# Patient Record
Sex: Male | Born: 1995 | Race: White | Hispanic: No | Marital: Single | Smoking: Never smoker
Health system: Southern US, Community
[De-identification: ages and names within clinical notes are randomized; demographics above are authoritative.]

---
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
## 2015-05-12 IMAGING — CR DG SHOULDER 2+V*L*
2 series · 2 of 2 positions shown · non-contrast
Comparison: None.

CLINICAL DATA: Dislocated left shoulder during football game

LEFT SHOULDER - 2+ VIEW

[x shoulder ap left]
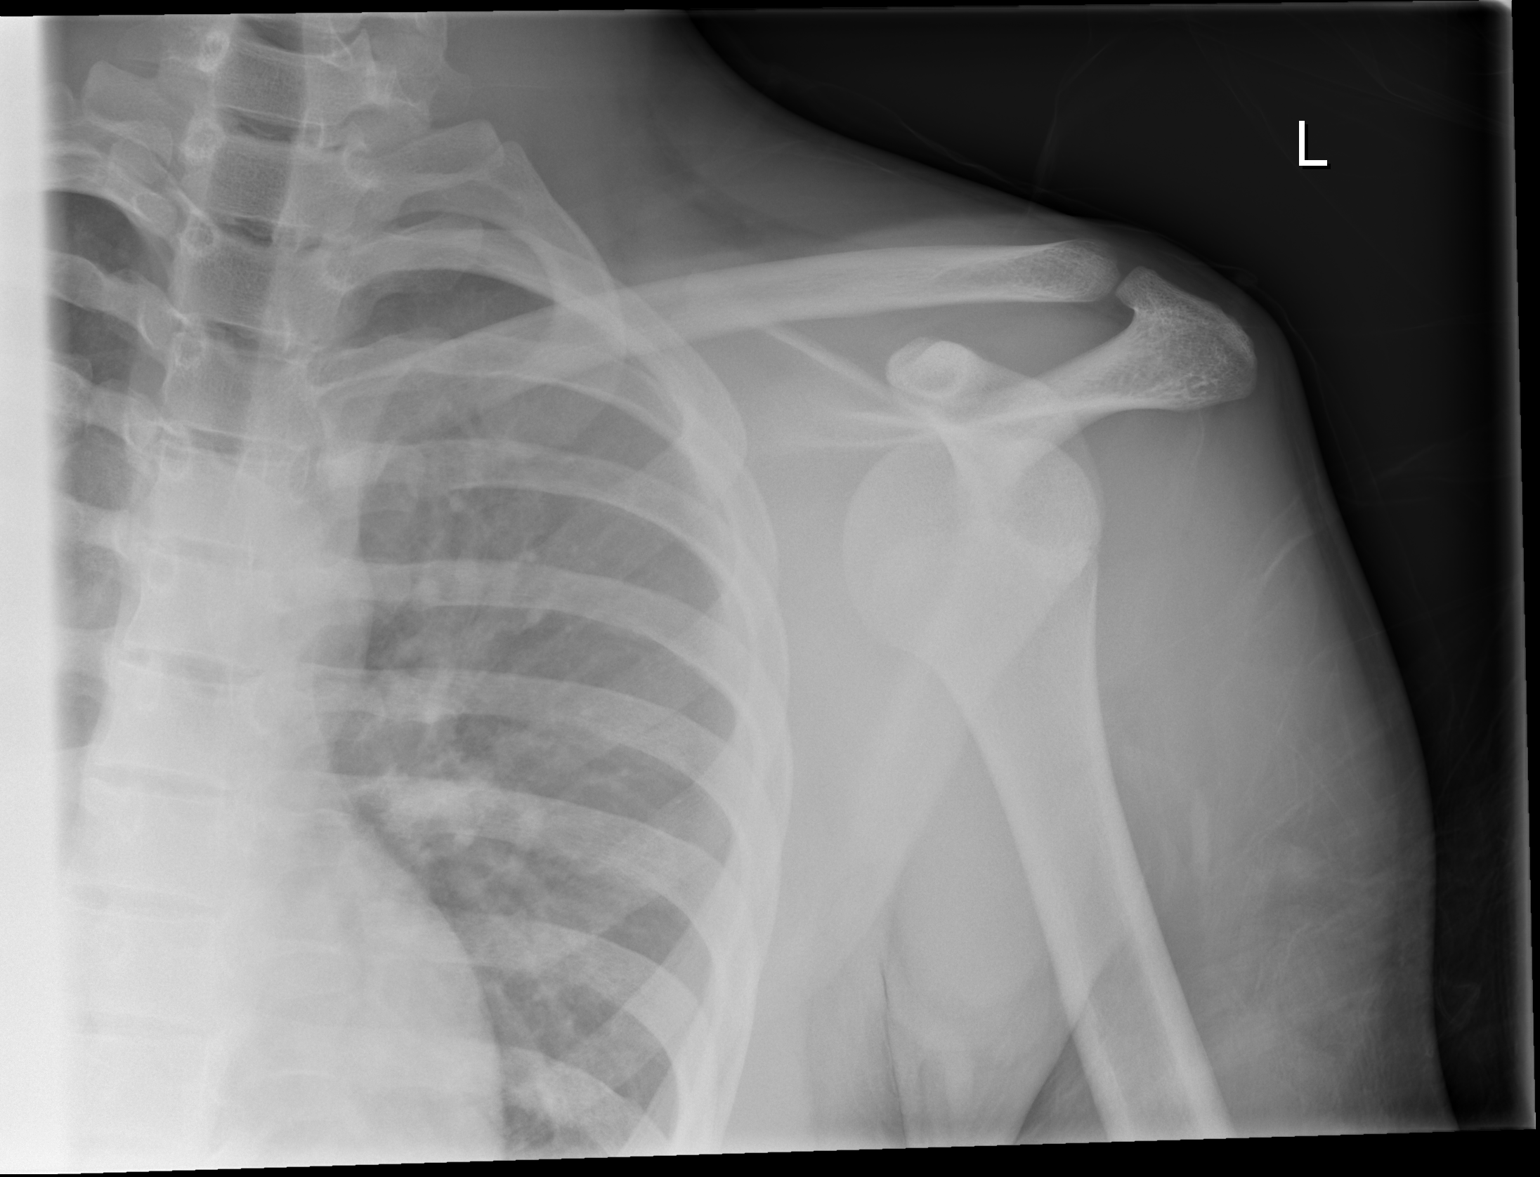

[x scapula y-view left]
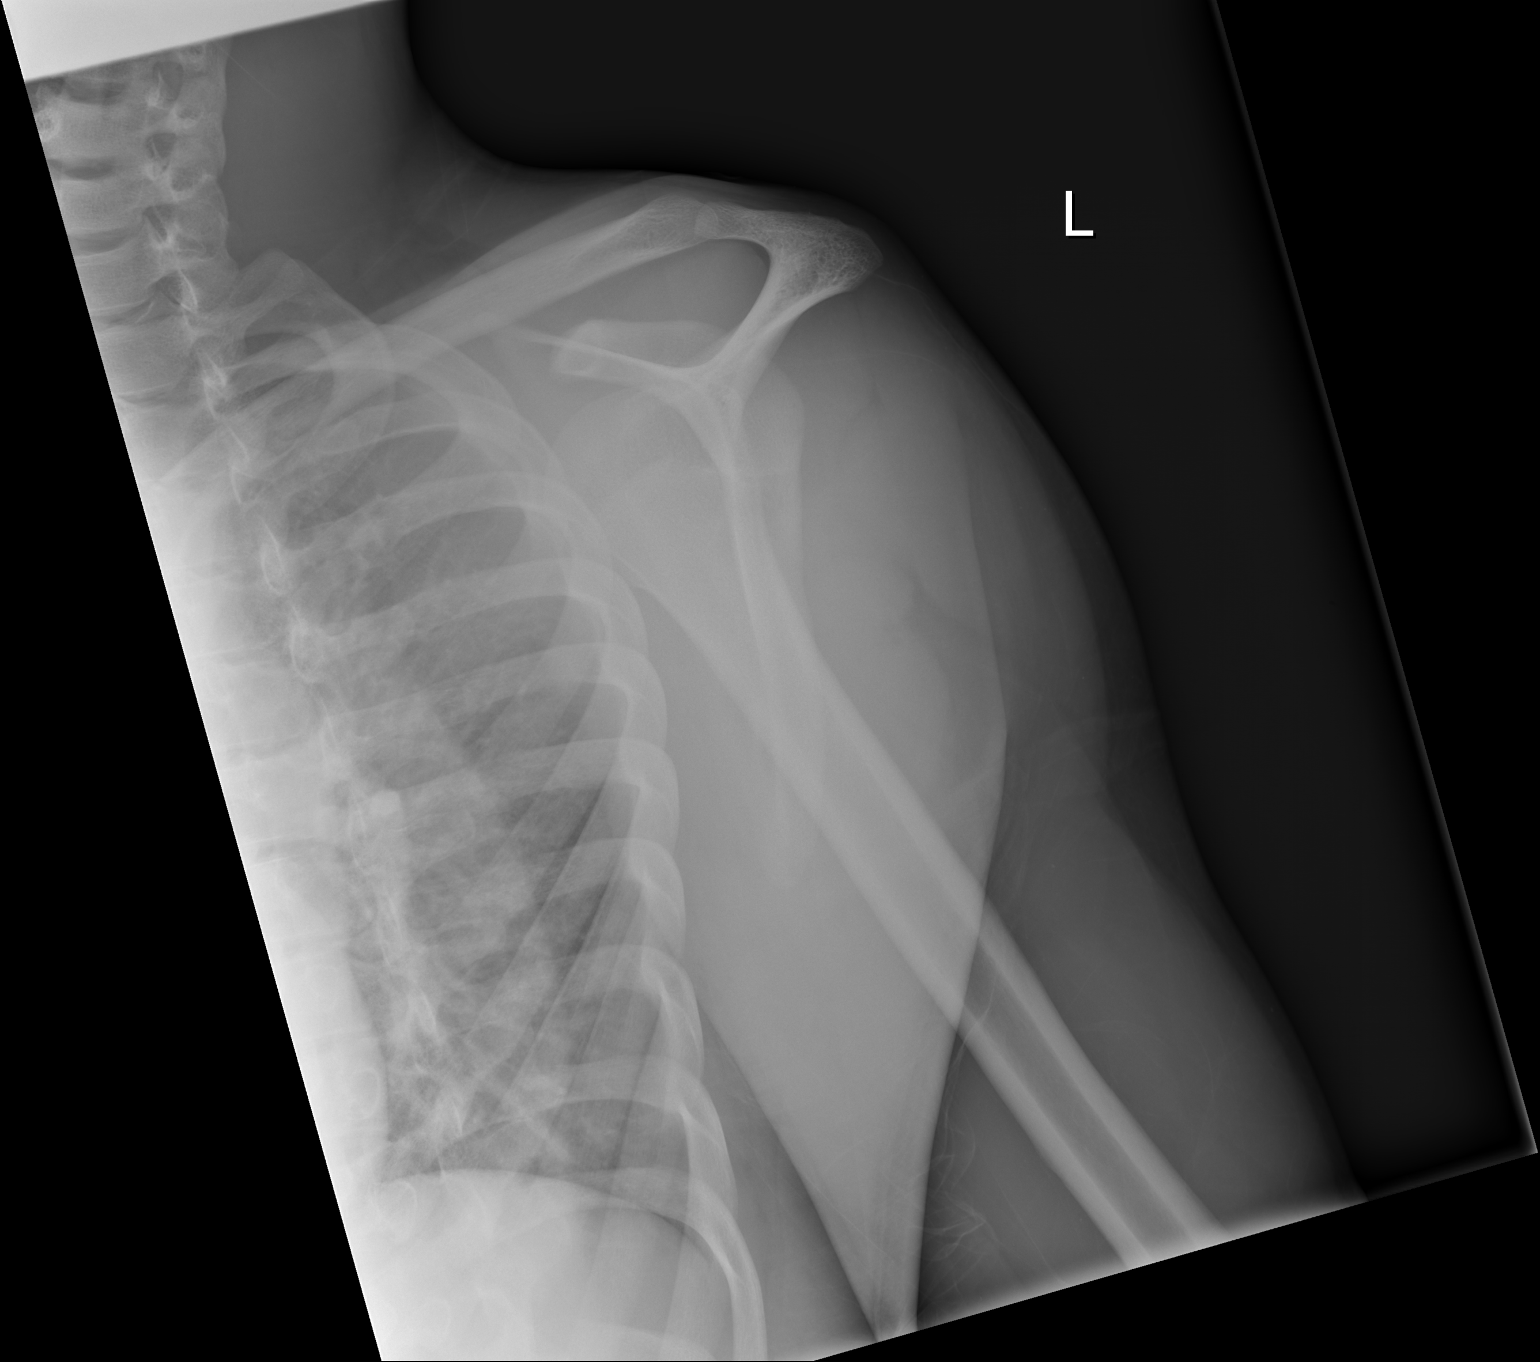

[2 of 2 positions shown; findings below may reference images not displayed]

FINDINGS: The left humeral head is anteriorly dislocated.  No
fracture is evident.  Acromioclavicular joint is aligned.
IMPRESSION: Anterior dislocation of the humeral head.

## 2015-05-12 IMAGING — CR DG SHOULDER 1V*L*
3 series · 3 of 3 positions shown · non-contrast
Comparison: Study obtained earlier in the day

CLINICAL DATA: Recent dislocation

EXAM:
PORTABLE LEFT SHOULDER - 2+ VIEW

[AP (1 of 3)]
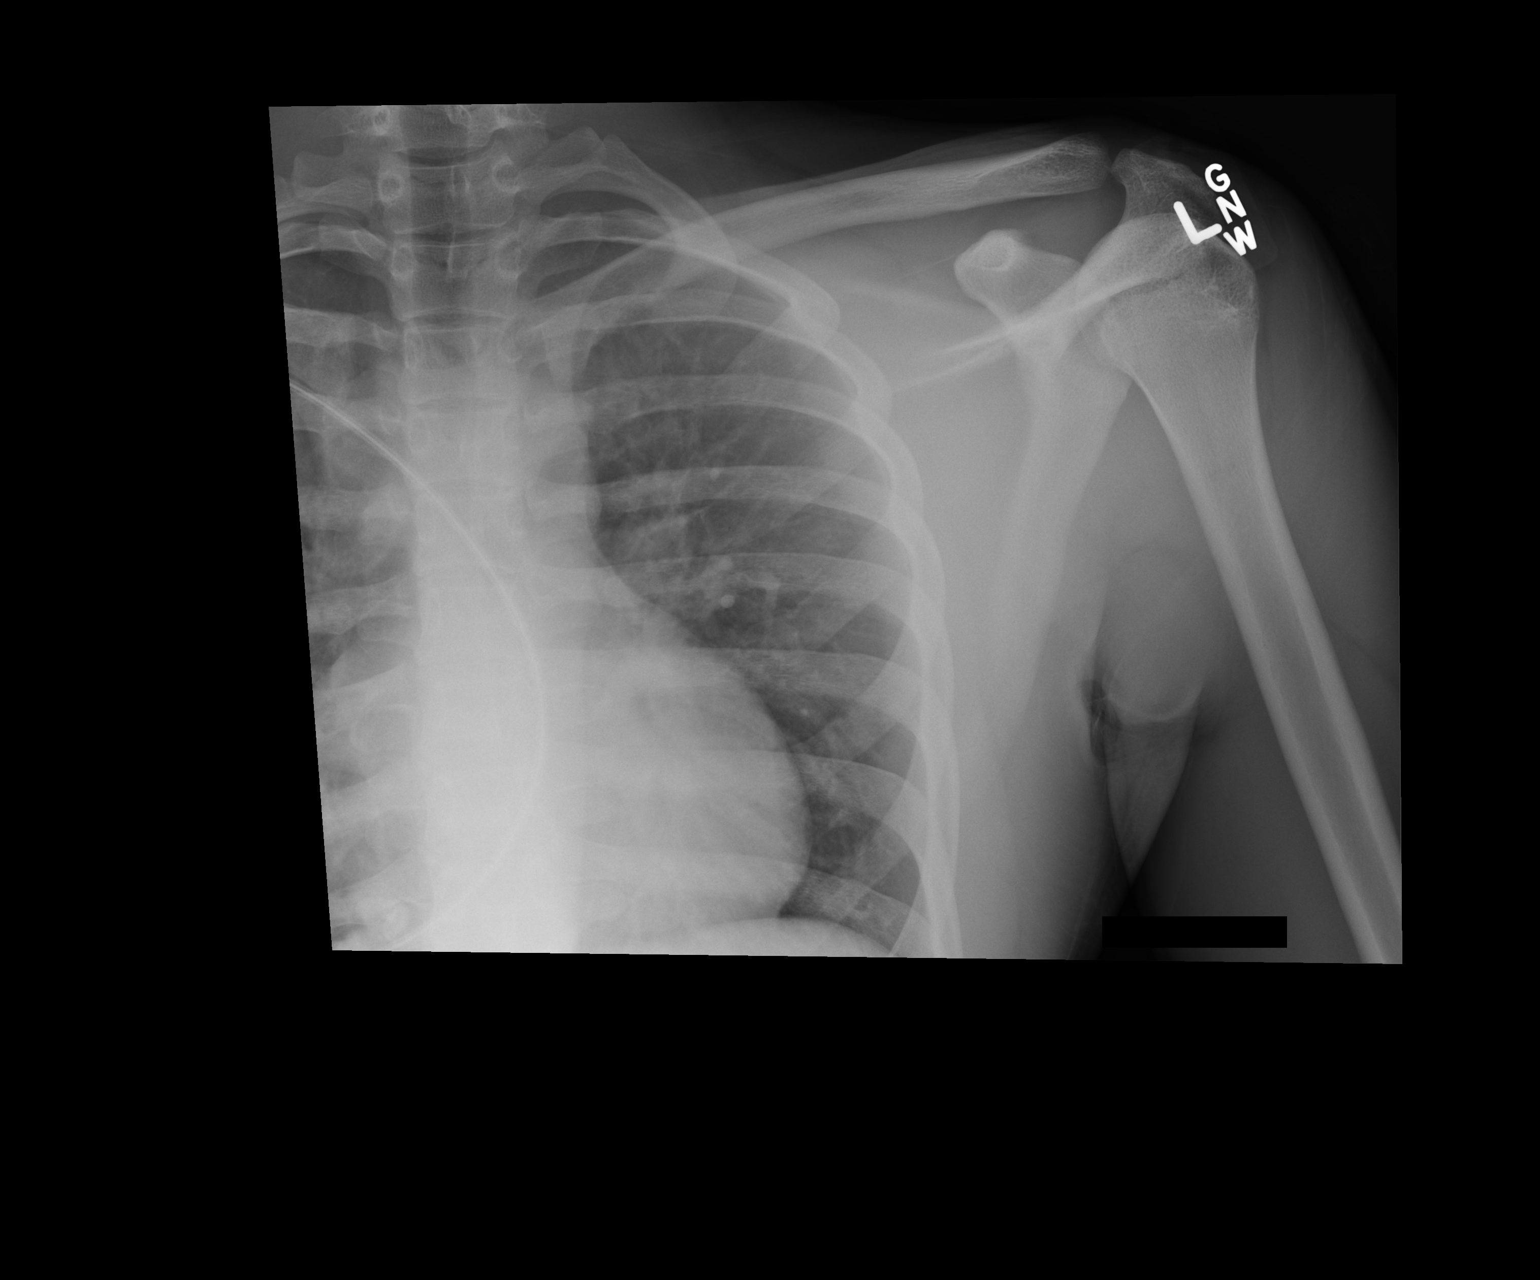

[AP (2 of 3)]
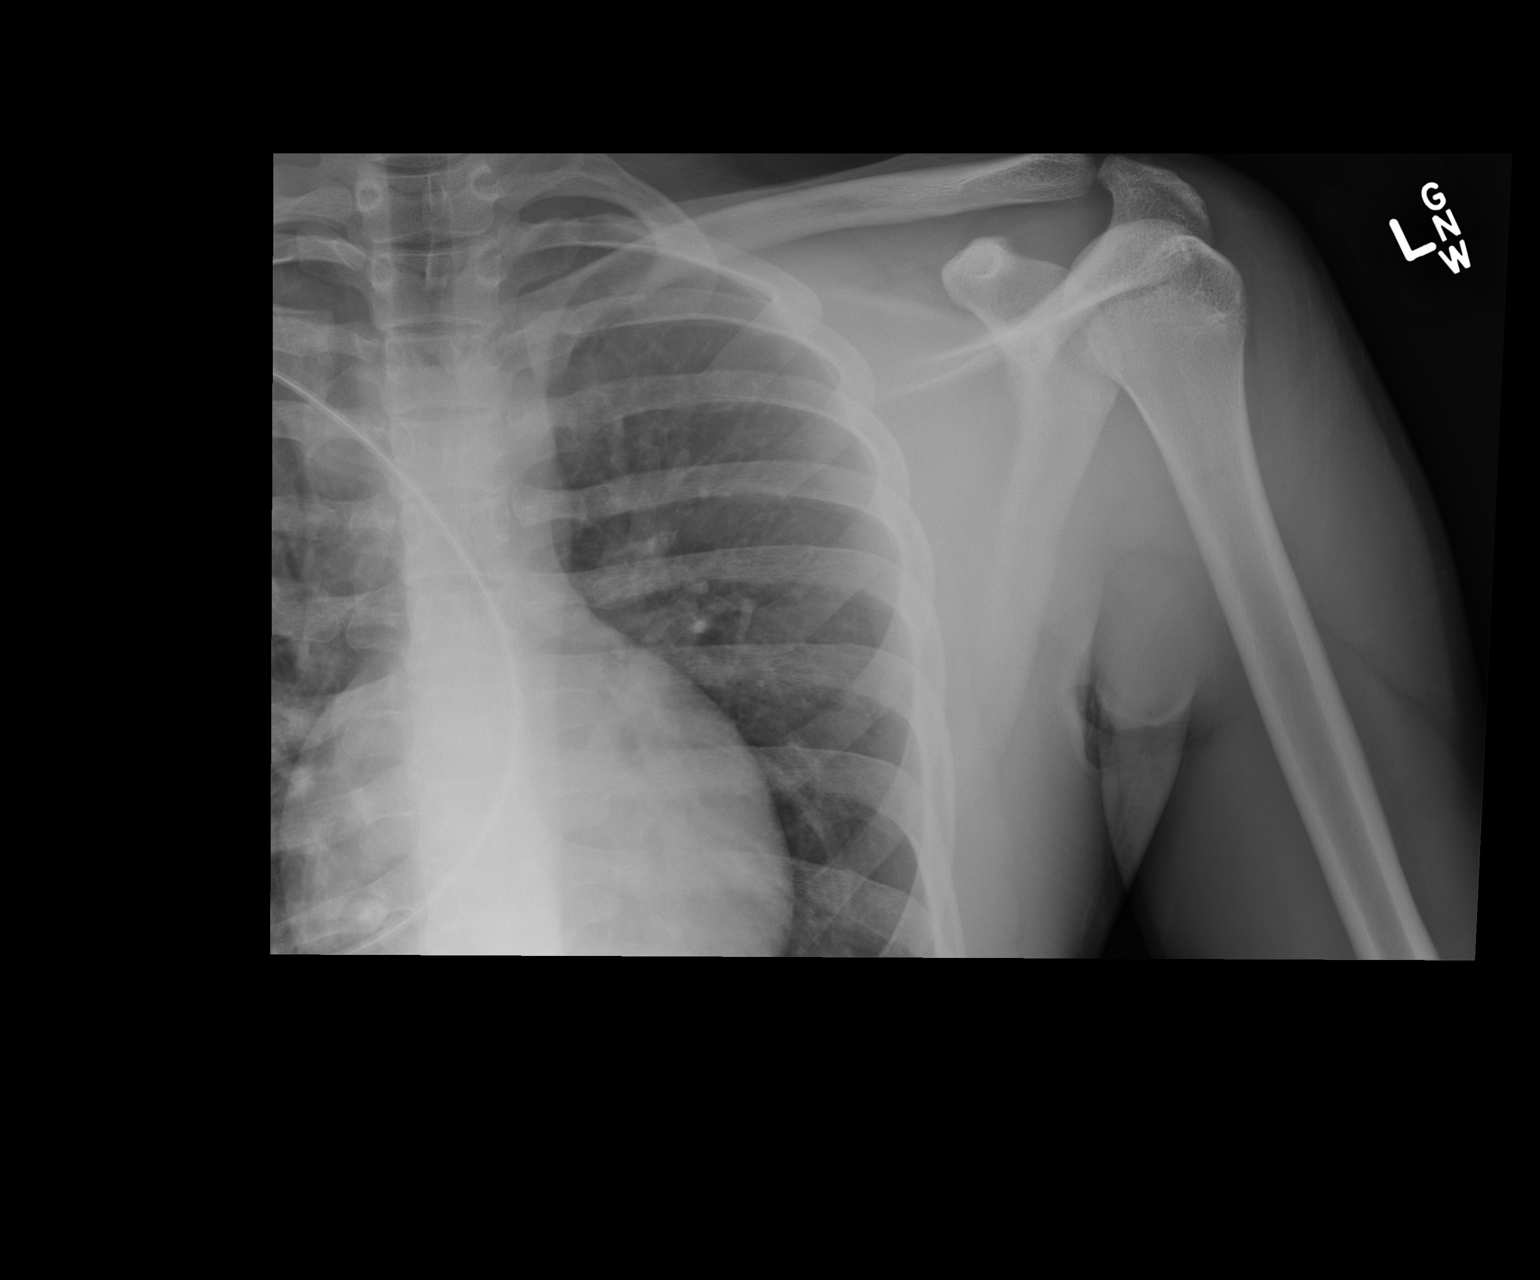

[AP (3 of 3)]
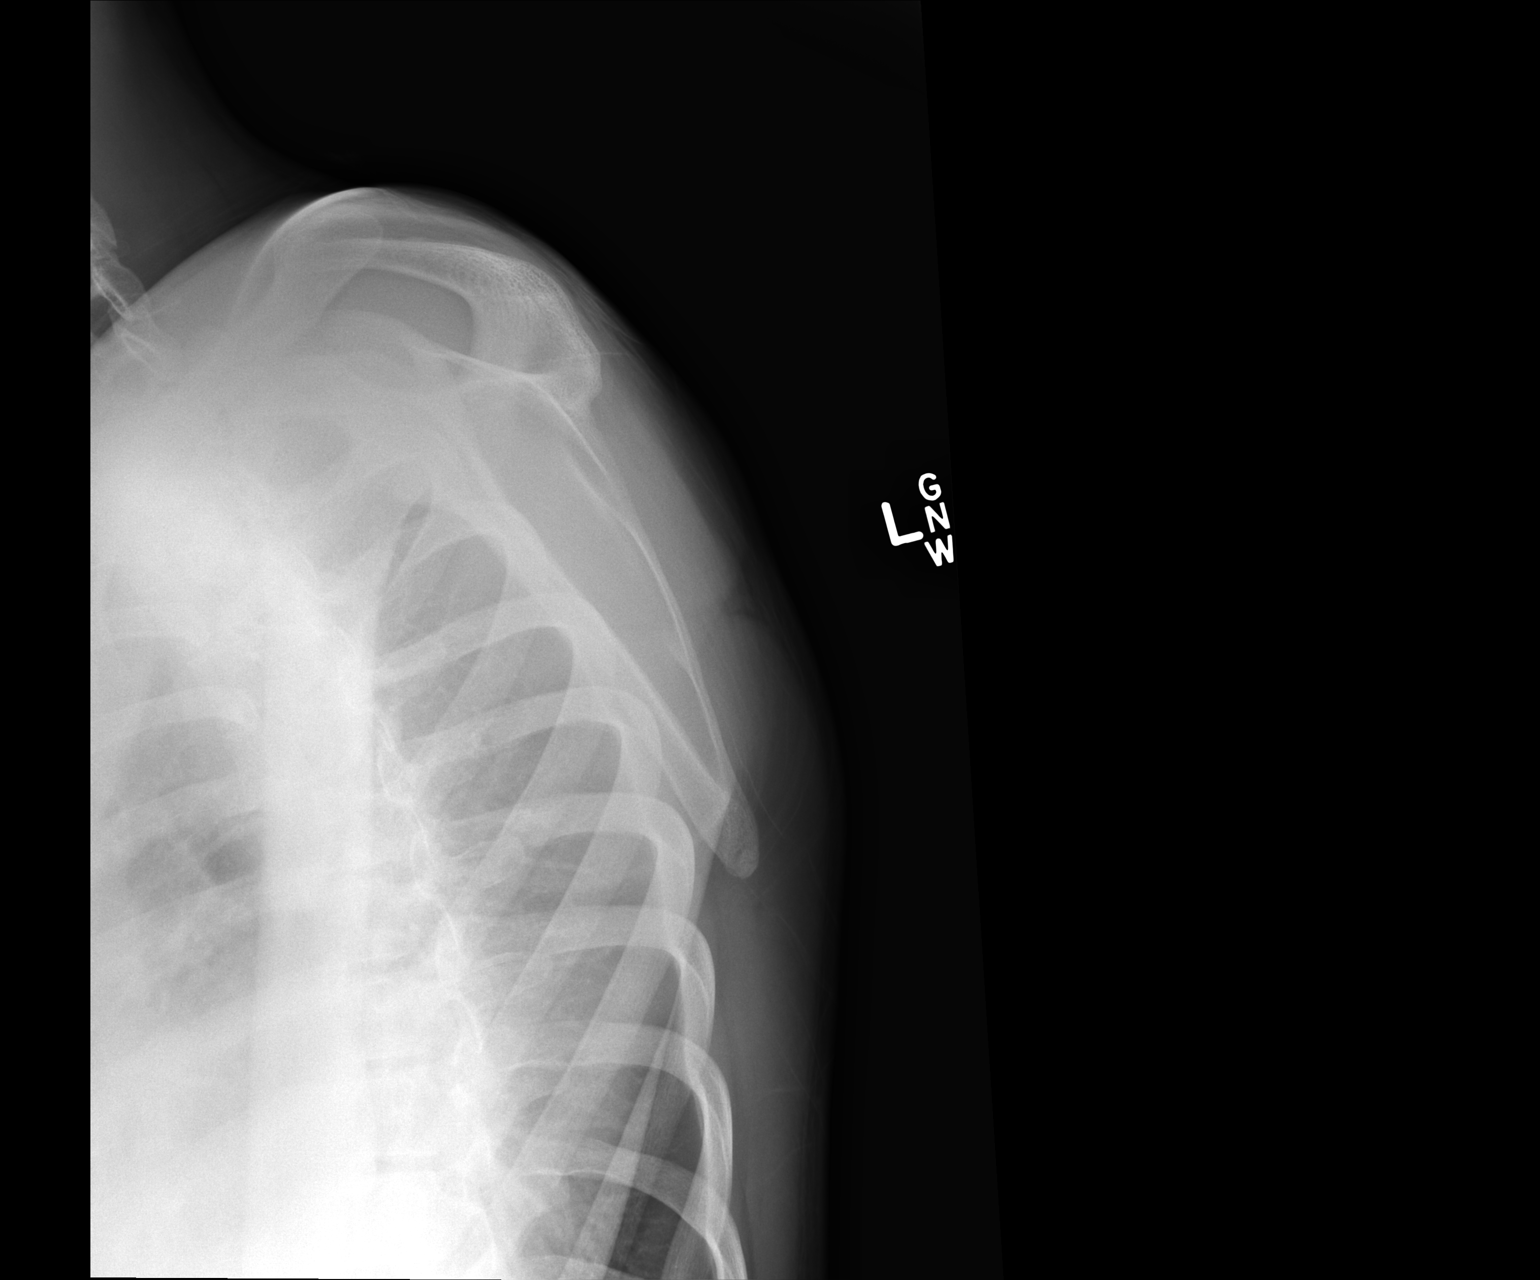

[3 of 3 positions shown; findings below may reference images not displayed]

FINDINGS: Frontal and Y scapular views were obtained. The previous anterior
dislocation has been reduced successfully. Currently there is no
demonstrable fracture or dislocation. Joint spaces appear intact.
IMPRESSION: Successful reduction of anterior dislocation. Currently no fracture
or dislocation appreciable.
